# Patient Record
Sex: Male | Born: 1945 | Race: White | Hispanic: No | Marital: Single | Smoking: Former smoker
Health system: Southern US, Community
[De-identification: ages and names within clinical notes are randomized; demographics above are authoritative.]

## PROBLEM LIST (undated history)

## (undated) DIAGNOSIS — C801 Malignant (primary) neoplasm, unspecified: Secondary | ICD-10-CM

## (undated) HISTORY — PX: OTHER SURGICAL HISTORY: SHX169

---

## 2020-07-08 ENCOUNTER — Other Ambulatory Visit: Payer: Self-pay

## 2020-07-08 ENCOUNTER — Emergency Department (HOSPITAL_BASED_OUTPATIENT_CLINIC_OR_DEPARTMENT_OTHER)
Admission: EM | Admit: 2020-07-08 | Discharge: 2020-07-08 | Disposition: A | Discharge status: Home / Self Care | Payer: No Typology Code available for payment source | Attending: Emergency Medicine | Admitting: Emergency Medicine

## 2020-07-08 ENCOUNTER — Encounter (HOSPITAL_BASED_OUTPATIENT_CLINIC_OR_DEPARTMENT_OTHER): Payer: Self-pay | Admitting: Emergency Medicine

## 2020-07-08 ENCOUNTER — Emergency Department (HOSPITAL_BASED_OUTPATIENT_CLINIC_OR_DEPARTMENT_OTHER): Payer: No Typology Code available for payment source

## 2020-07-08 DIAGNOSIS — U071 COVID-19: Secondary | ICD-10-CM | POA: Diagnosis not present

## 2020-07-08 DIAGNOSIS — Z85828 Personal history of other malignant neoplasm of skin: Secondary | ICD-10-CM | POA: Insufficient documentation

## 2020-07-08 DIAGNOSIS — R0602 Shortness of breath: Secondary | ICD-10-CM

## 2020-07-08 DIAGNOSIS — Z87891 Personal history of nicotine dependence: Secondary | ICD-10-CM | POA: Insufficient documentation

## 2020-07-08 DIAGNOSIS — R059 Cough, unspecified: Secondary | ICD-10-CM

## 2020-07-08 HISTORY — DX: Malignant (primary) neoplasm, unspecified: C80.1

## 2020-07-08 LAB — CBC
HCT: 44.7 % (ref 39.0–52.0)
Hemoglobin: 15.4 g/dL (ref 13.0–17.0)
MCH: 29.3 pg (ref 26.0–34.0)
MCHC: 34.5 g/dL (ref 30.0–36.0)
MCV: 85.1 fL (ref 80.0–100.0)
Platelets: 103 10*3/uL — ABNORMAL LOW (ref 150–400)
RBC: 5.25 MIL/uL (ref 4.22–5.81)
RDW: 13.5 % (ref 11.5–15.5)
WBC: 3.4 10*3/uL — ABNORMAL LOW (ref 4.0–10.5)
nRBC: 0 % (ref 0.0–0.2)

## 2020-07-08 LAB — BASIC METABOLIC PANEL
Anion gap: 11 (ref 5–15)
BUN: 21 mg/dL (ref 8–23)
CO2: 22 mmol/L (ref 22–32)
Calcium: 8.7 mg/dL — ABNORMAL LOW (ref 8.9–10.3)
Chloride: 104 mmol/L (ref 98–111)
Creatinine, Ser: 1.26 mg/dL — ABNORMAL HIGH (ref 0.61–1.24)
GFR, Estimated: 59 mL/min — ABNORMAL LOW (ref >60)
Glucose, Bld: 97 mg/dL (ref 70–99)
Potassium: 3.8 mmol/L (ref 3.5–5.1)
Sodium: 137 mmol/L (ref 135–145)

## 2020-07-08 LAB — TROPONIN I (HIGH SENSITIVITY): Troponin I (High Sensitivity): 7 ng/L (ref <18)

## 2020-07-08 NOTE — ED Provider Notes (Signed)
[facility] EMERGENCY DEPARTMENT Provider Note   CSN: [id number] Arrival date & time: 07/08/20  1609     History Chief Complaint  Patient presents with  . Shortness of Breath    Fernando Benton is a 75 y.o. male.  The history is provided by the patient.  Cough Cough characteristics:  Non-productive Sputum characteristics:  Nondescript Severity:  Mild Onset quality:  Gradual Timing:  Intermittent Progression:  Waxing and waning Chronicity:  New Context: not upper respiratory infection   Context comment:  First covid vaccine several days ago and some intermittent cough and SOB. No chest heavyiness or crushing chest pain.  Relieved by:  Nothing Worsened by:  Nothing Associated symptoms: shortness of breath   Associated symptoms: no chest pain, no chills, no diaphoresis, no ear fullness, no ear pain, no fever, no rash and no sore throat   Risk factors: no recent infection   Risk factors comment:  Former smoker      Past Medical History:  Diagnosis Date  . 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   History reviewed. No pertinent surgical history.     No family history on file.  Social History   Tobacco Use  . Smoking status: Former Research scientist (life sciences)  . Smokeless tobacco: Never Used  Substance Use Topics  . Alcohol use: Yes    Alcohol/week: 1.0 standard drink    Types: 1 Glasses of wine per week    Comment: weekly  . Drug use: Not Currently    Types: Marijuana    Home Medications Prior to Admission medications   Not on File    Allergies    Patient has no allergy information on record.  Review of Systems   Review of Systems  Constitutional: Negative for chills, diaphoresis and fever.  HENT: Negative for ear pain and sore throat.   Eyes: Negative for pain and visual disturbance.  Respiratory: Positive for cough and shortness of breath.   Cardiovascular: Negative for chest pain and palpitations.   Gastrointestinal: Negative for abdominal pain and vomiting.  Genitourinary: Negative for dysuria and hematuria.  Musculoskeletal: Negative for arthralgias and back pain.  Skin: Negative for color change and rash.  Neurological: Negative for seizures and syncope.  All other systems reviewed and are negative.   Physical Exam Updated Vital Signs  ED Triage Vitals  Enc Vitals Group     BP 07/08/20 1623 131/84     Pulse Rate 07/08/20 1623 62     Resp 07/08/20 1623 18     Temp 07/08/20 1623 98.4 F (36.9 C)     Temp Source 07/08/20 1623 Oral     SpO2 07/08/20 1623 100 %     Weight 07/08/20 1624 193 lb (87.5 kg)     Height 07/08/20 1624 6\' 3"  (1.905 m)     Head Circumference --      Peak Flow --      Pain Score 07/08/20 1624 0     Pain Loc --      Pain Edu? --      Excl. in [place]? --     Physical Exam Vitals and nursing note reviewed.  Constitutional:      General: He is not in acute distress.    Appearance: He is well-developed and well-nourished. He is not ill-appearing.  HENT:     Head: Normocephalic and atraumatic.  Eyes:     Conjunctiva/sclera: Conjunctivae normal.  Pupils: Pupils are equal, round, and reactive to light.  Cardiovascular:     Rate and Rhythm: Normal rate and regular rhythm.     Pulses: Normal pulses.     Heart sounds: Normal heart sounds. No murmur heard.   Pulmonary:     Effort: Pulmonary effort is normal. No respiratory distress.     Breath sounds: Normal breath sounds. No decreased breath sounds or wheezing.  Abdominal:     Palpations: Abdomen is soft.     Tenderness: There is no abdominal tenderness.  Musculoskeletal:        General: No edema.     Cervical back: Normal range of motion and neck supple.  Skin:    General: Skin is warm and dry.     Capillary Refill: Capillary refill takes less than 2 seconds.  Neurological:     General: No focal deficit present.     Mental Status: He is alert.  Psychiatric:        Mood and Affect: Mood  and affect normal.     ED Results / Procedures / Treatments   Labs (all labs ordered are listed, but only abnormal results are displayed) Labs Reviewed  BASIC METABOLIC PANEL - Abnormal; Notable for the following components:      Result Value   Creatinine, Ser 1.26 (*)    Calcium 8.7 (*)    GFR, Estimated 59 (*)    All other components within normal limits  CBC - Abnormal; Notable for the following components:   WBC 3.4 (*)    Platelets 103 (*)    All other components within normal limits  SARS CORONAVIRUS 2 (TAT 6-24 HRS)  TROPONIN I (HIGH SENSITIVITY)    EKG EKG Interpretation  Date/Time:  Saturday July 08 2020 16:26:43 EST Ventricular Rate:  78 PR Interval:    QRS Duration: 92 QT Interval:  400 QTC Calculation: 456 R Axis:   95 Text Interpretation: Sinus rhythm Rightward axis Nonspecific ST abnormality Abnormal ECG Confirmed by Lennice Sites [phone]) on 07/08/2020 6:38:28 PM   Radiology DG Chest 2 View  Result Date: 07/08/2020 CLINICAL DATA:  Chest pain and shortness of breath. EXAM: CHEST - 2 VIEW COMPARISON:  None FINDINGS: Heart size is normal. Mediastinal shadows are normal. 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 No sign of infiltrate, effusion or collapse. Snap artifacts overlie both upper lobes. IMPRESSION: No active disease. 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 Electronically Signed   By: Nelson Chimes M.D.   On: 07/08/2020 17:21    Procedures Procedures (including critical care time)  Medications Ordered in ED Medications - No data to display  ED Course  I have reviewed the triage vital signs and the nursing notes.  Pertinent labs & imaging results that were available during my care of the patient were reviewed by me and considered in my medical decision making (see chart for details).    MDM Rules/Calculators/A&P                          Fernando Benton is a 75 year old male with no significant medical history presents the ED with cough,  shortness of breath.  No fever.  Normal vitals.  Ongoing for the last several days.  Recently had his first Covid vaccine.  Has nondescript cough.  No real chest pain.  Overall appears comfortable.  Not having any active symptoms currently.  Chest x-ray shows no signs of pneumonia, no pneumothorax.  Troponin normal.  EKG  shows sinus rhythm.  No ischemic changes.  Doubt ACS.  Overall suspect either he has Covid or vaccine side effect or other nonemergent process.  Lab work was overall unremarkable.  Will check patient for Covid.  Recommend that he continue to monitor symptoms at home and if symptoms worsen to return especially if he develops severe crushing chest pain or severe shortness of breath.  Recommend close follow-up with primary care doctor discharged in ED in good condition.  This chart was dictated using voice recognition software.  Despite best efforts to proofread,  errors can occur which can change the documentation meaning.  Fernando Benton was evaluated in Emergency Department on 07/08/2020 for the symptoms described in the history of present illness. He was evaluated in the context of the global COVID-19 pandemic, which necessitated consideration that the patient might be at risk for infection with the SARS-CoV-2 virus that causes COVID-19. Institutional protocols and algorithms that pertain to the evaluation of patients at risk for COVID-19 are in a state of rapid change based on information released by regulatory bodies including the CDC and federal and state organizations. These policies and algorithms were followed during the patient's care in the ED.   Final Clinical Impression(s) / ED Diagnoses Final diagnoses:  Shortness of breath  Cough    Rx / DC Orders ED Discharge Orders    None       Lennice Sites, DO 07/08/20 1912

## 2020-07-08 NOTE — ED Notes (Signed)
Spoke w pt family member off pt cell phone explained process of how to access mychart, that pt had covid swab completed in ED. Explained to pt how to access mychart to review results. Reassured pt vitals WNL, pt left ED w/o incident.

## 2020-07-08 NOTE — ED Triage Notes (Addendum)
Reports having SOB all day today.  Got Pfizer vaccine on the 5th (1st shot).  Also endorses diarrhea for the last month.  Has been seen at the [facility] regarding the diarrhea.  Reports they drew blood yesterday.  Will get results on Monday.  Reports decreased appetite.   Also reporting intermittent chest pain.

## 2020-07-09 LAB — SARS CORONAVIRUS 2 (TAT 6-24 HRS): SARS Coronavirus 2: POSITIVE — AB

## 2020-07-10 ENCOUNTER — Telehealth: Payer: Self-pay

## 2020-07-10 NOTE — Telephone Encounter (Signed)
I connected by phone with Fernando Benton on 07/10/2020 at 11:15 AM to discuss the potential use of a new treatment for mild to moderate COVID-19 viral infection in non-hospitalized patients.  This patient is a 75 y.o. male that meets the FDA criteria for Emergency Use Authorization of COVID monoclonal antibody sotrovimab.  Has a (+) direct SARS-CoV-2 viral test result  Has mild or moderate COVID-19   Is NOT hospitalized due to COVID-19  Is within 10 days of symptom onset  Has at least one of the high risk factor(s) for progression to severe COVID-19 and/or hospitalization as defined in EUA.  Specific high risk criteria : Older age (>/= 76 yo)   I have spoken and communicated the following to the patient or parent/caregiver regarding COVID monoclonal antibody treatment:  1. FDA has authorized the emergency use for the treatment of mild to moderate COVID-19 in adults and pediatric patients with positive results of direct SARS-CoV-2 viral testing who are 38 years of age and older weighing at least 40 kg, and who are at high risk for progressing to severe COVID-19 and/or hospitalization.  2. The significant known and potential risks and benefits of COVID monoclonal antibody, and the extent to which such potential risks and benefits are unknown.  3. Information on available alternative treatments and the risks and benefits of those alternatives, including clinical trials.  4. Patients treated with COVID monoclonal antibody should continue to self-isolate and use infection control measures (e.g., wear mask, isolate, social distance, avoid sharing personal items, clean and disinfect "high touch" surfaces, and frequent handwashing) according to CDC guidelines.   5. The patient or parent/caregiver has the option to accept or refuse COVID monoclonal antibody treatment.  After reviewing this information with the patient, the patient has DECLINED offer to receive the infusion. Fernando Moores,  RN 07/10/2020 11:15 AM

## 2020-09-04 ENCOUNTER — Emergency Department (HOSPITAL_BASED_OUTPATIENT_CLINIC_OR_DEPARTMENT_OTHER): Payer: No Typology Code available for payment source

## 2020-09-04 ENCOUNTER — Encounter (HOSPITAL_BASED_OUTPATIENT_CLINIC_OR_DEPARTMENT_OTHER): Payer: Self-pay | Admitting: Emergency Medicine

## 2020-09-04 ENCOUNTER — Emergency Department (HOSPITAL_BASED_OUTPATIENT_CLINIC_OR_DEPARTMENT_OTHER)
Admission: EM | Admit: 2020-09-04 | Discharge: 2020-09-04 | Disposition: A | Discharge status: Home / Self Care | Payer: No Typology Code available for payment source | Attending: Emergency Medicine | Admitting: Emergency Medicine

## 2020-09-04 ENCOUNTER — Other Ambulatory Visit: Payer: Self-pay

## 2020-09-04 DIAGNOSIS — Z859 Personal history of malignant neoplasm, unspecified: Secondary | ICD-10-CM | POA: Diagnosis not present

## 2020-09-04 DIAGNOSIS — S199XXA Unspecified injury of neck, initial encounter: Secondary | ICD-10-CM | POA: Diagnosis present

## 2020-09-04 DIAGNOSIS — Y92410 Unspecified street and highway as the place of occurrence of the external cause: Secondary | ICD-10-CM | POA: Diagnosis not present

## 2020-09-04 DIAGNOSIS — S0990XA Unspecified injury of head, initial encounter: Secondary | ICD-10-CM | POA: Insufficient documentation

## 2020-09-04 DIAGNOSIS — S161XXA Strain of muscle, fascia and tendon at neck level, initial encounter: Secondary | ICD-10-CM | POA: Insufficient documentation

## 2020-09-04 DIAGNOSIS — Z87891 Personal history of nicotine dependence: Secondary | ICD-10-CM | POA: Insufficient documentation

## 2020-09-04 MED ORDER — HYDROCODONE-ACETAMINOPHEN 5-325 MG PO TABS: 1.0000 | ORAL_TABLET | Freq: Four times a day (QID) | ORAL | 0 refills | Status: AC | PRN

## 2020-09-04 MED ORDER — NAPROXEN 250 MG PO TABS
500.0000 mg | ORAL_TABLET | Freq: Once | ORAL | Status: AC
  Administered 2020-09-04: 500 mg via ORAL
  Filled 2020-09-04: qty 2
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
## 2020-09-14 ENCOUNTER — Encounter (HOSPITAL_BASED_OUTPATIENT_CLINIC_OR_DEPARTMENT_OTHER): Payer: Self-pay | Admitting: Emergency Medicine

## 2020-09-14 ENCOUNTER — Other Ambulatory Visit: Payer: Self-pay

## 2020-09-14 DIAGNOSIS — Z859 Personal history of malignant neoplasm, unspecified: Secondary | ICD-10-CM | POA: Insufficient documentation

## 2020-09-14 DIAGNOSIS — M542 Cervicalgia: Secondary | ICD-10-CM | POA: Insufficient documentation

## 2020-09-14 DIAGNOSIS — Z87891 Personal history of nicotine dependence: Secondary | ICD-10-CM | POA: Insufficient documentation
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
## 2020-09-15 ENCOUNTER — Emergency Department (HOSPITAL_BASED_OUTPATIENT_CLINIC_OR_DEPARTMENT_OTHER)
Admission: EM | Admit: 2020-09-15 | Discharge: 2020-09-15 | Disposition: A | Discharge status: Home / Self Care | Payer: No Typology Code available for payment source | Attending: Emergency Medicine | Admitting: Emergency Medicine

## 2020-09-15 DIAGNOSIS — S161XXD Strain of muscle, fascia and tendon at neck level, subsequent encounter: Secondary | ICD-10-CM

## 2020-09-15 MED ORDER — PREDNISONE 10 MG PO TABS: 20.0000 mg | ORAL_TABLET | Freq: Two times a day (BID) | ORAL | 0 refills | Status: AC

## 2020-09-15 MED ORDER — CYCLOBENZAPRINE HCL 10 MG PO TABS: 10.0000 mg | ORAL_TABLET | Freq: Three times a day (TID) | ORAL | 0 refills | Status: AC | PRN

## 2020-09-15 MED ORDER — PREDNISONE 20 MG PO TABS
20.0000 mg | ORAL_TABLET | Freq: Once | ORAL | Status: AC
  Administered 2020-09-15: 20 mg via ORAL
  Filled 2020-09-15: qty 1
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
## 2020-09-15 NOTE — Discharge Instructions (Addendum)
Begin taking prednisone twice daily as prescribed.  Begin taking Flexeril as prescribed as needed for pain.  Follow-up with your primary doctor if symptoms are not improving in the next week to discuss a physical therapy or possibly additional imaging studies.

## 2021-01-11 ENCOUNTER — Encounter (HOSPITAL_BASED_OUTPATIENT_CLINIC_OR_DEPARTMENT_OTHER): Payer: Self-pay | Admitting: *Deleted

## 2021-01-11 ENCOUNTER — Emergency Department (HOSPITAL_BASED_OUTPATIENT_CLINIC_OR_DEPARTMENT_OTHER)
Admission: EM | Admit: 2021-01-11 | Discharge: 2021-01-11 | Disposition: A | Discharge status: Home / Self Care | Payer: No Typology Code available for payment source | Attending: Emergency Medicine | Admitting: Emergency Medicine

## 2021-01-11 ENCOUNTER — Other Ambulatory Visit: Payer: Self-pay

## 2021-01-11 ENCOUNTER — Emergency Department (HOSPITAL_BASED_OUTPATIENT_CLINIC_OR_DEPARTMENT_OTHER): Payer: No Typology Code available for payment source

## 2021-01-11 DIAGNOSIS — Z85828 Personal history of other malignant neoplasm of skin: Secondary | ICD-10-CM | POA: Diagnosis not present

## 2021-01-11 DIAGNOSIS — Z87891 Personal history of nicotine dependence: Secondary | ICD-10-CM | POA: Diagnosis not present

## 2021-01-11 DIAGNOSIS — I10 Essential (primary) hypertension: Secondary | ICD-10-CM

## 2021-01-11 DIAGNOSIS — R519 Headache, unspecified: Secondary | ICD-10-CM | POA: Diagnosis present

## 2021-01-11 MED ORDER — LACTATED RINGERS IV BOLUS
1000.0000 mL | Freq: Once | INTRAVENOUS | Status: AC
  Administered 2021-01-11: 1000 mL via INTRAVENOUS

## 2021-01-11 MED ORDER — PROCHLORPERAZINE EDISYLATE 10 MG/2ML IJ SOLN
10.0000 mg | Freq: Once | INTRAMUSCULAR | Status: AC
  Administered 2021-01-11: 10 mg via INTRAVENOUS
  Filled 2021-01-11: qty 2

## 2021-01-11 MED ORDER — DIPHENHYDRAMINE HCL 50 MG/ML IJ SOLN
25.0000 mg | Freq: Once | INTRAMUSCULAR | Status: AC
  Administered 2021-01-11: 25 mg via INTRAVENOUS
  Filled 2021-01-11: qty 1

## 2021-01-11 MED ORDER — DEXAMETHASONE SODIUM PHOSPHATE 10 MG/ML IJ SOLN
10.0000 mg | Freq: Once | INTRAMUSCULAR | Status: AC
  Administered 2021-01-11: 10 mg via INTRAVENOUS
  Filled 2021-01-11: qty 1

## 2021-01-11 NOTE — ED Triage Notes (Signed)
Pt reports HA since march 2022.  No OTC meds since last week.

## 2021-01-11 NOTE — Discharge Instructions (Addendum)
Your CT scan did not show any evidence of a brain injury.  If your headaches continue to bother you, please follow-up with the neurology office.
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
## 2021-01-14 ENCOUNTER — Telehealth (HOSPITAL_BASED_OUTPATIENT_CLINIC_OR_DEPARTMENT_OTHER): Payer: Self-pay

## 2021-01-14 NOTE — Telephone Encounter (Signed)
Pt called to ask about the medications listed on his AVS from recent visit. Pt advised no medication was prescribed during that visit and the medications he was seeing are old medications that he can ask his PCP to take off his list. Pt had no further questions or needs at this time.

## 2021-02-05 ENCOUNTER — Ambulatory Visit: Payer: Medicare Other | Admitting: Diagnostic Neuroimaging

## 2022-12-07 IMAGING — CT CT HEAD W/O CM
3 series · 16 of 47 positions shown, 19 images · non-contrast
Comparison: None.

CLINICAL DATA: 74-year-old male with headache.

EXAM:
CT HEAD WITHOUT CONTRAST
TECHNIQUE: Contiguous axial images were obtained from the base of the skull
through the vertex without intravenous contrast.

[Series 2: head wo · axial · 0.46mm/px · z∈[-192,-38]mm · 10 of 37 slices shown, 13 images]
[im 3/37  brain]
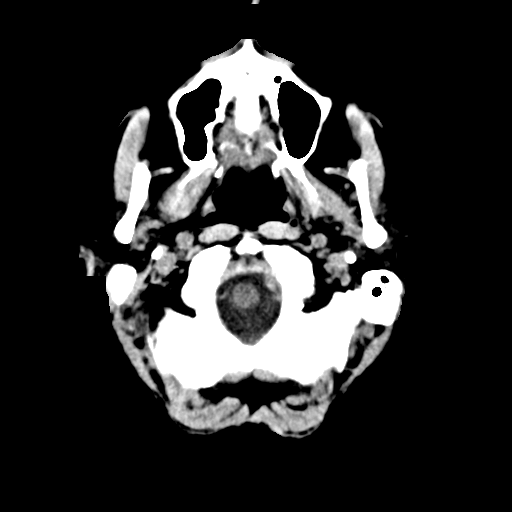
[im 3/37  bone]
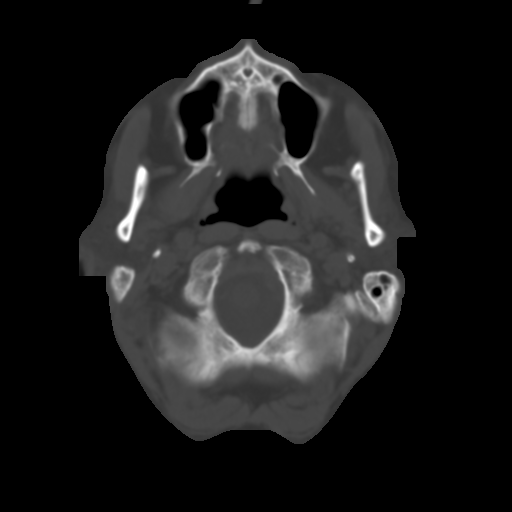
[im 7/37  brain]
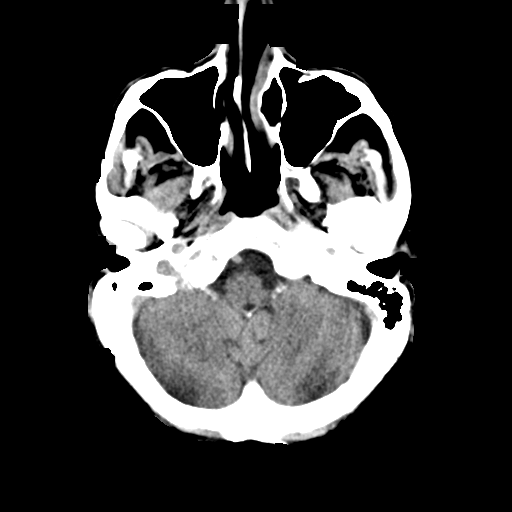
[im 10/37  brain]
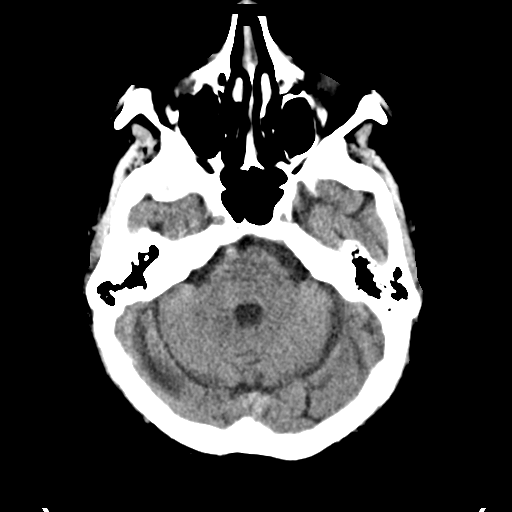
[im 13/37  brain]
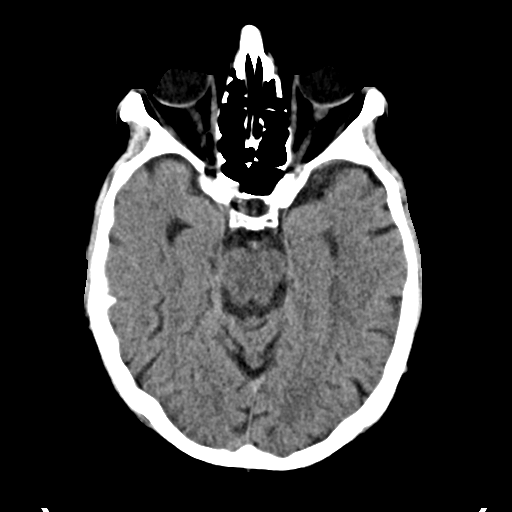
[im 17/37  brain]
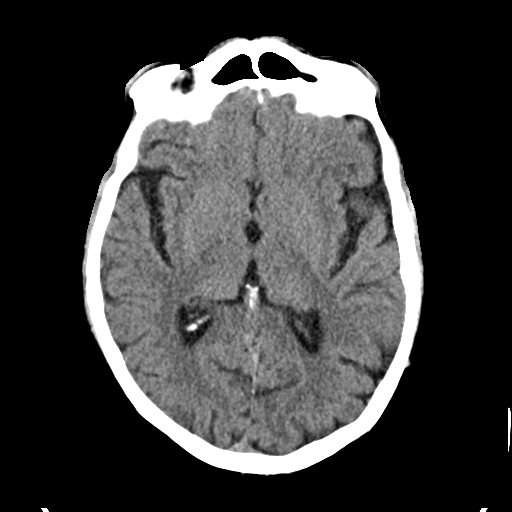
[im 17/37  bone]
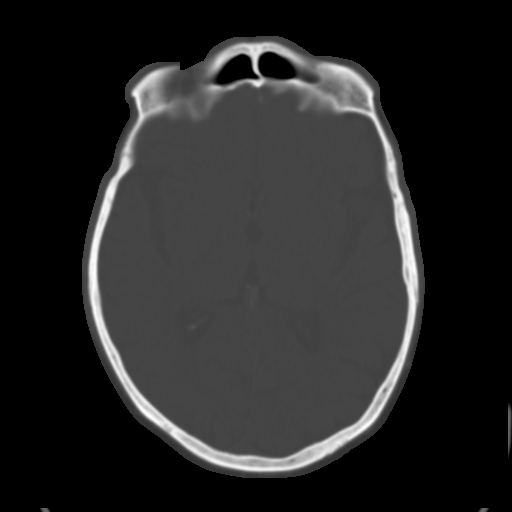
[im 20/37  brain]
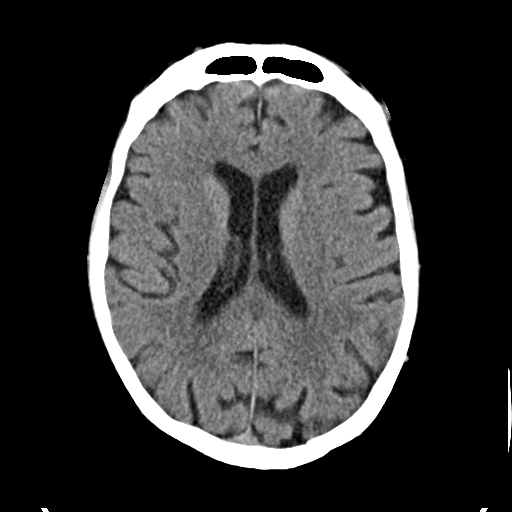
[im 24/37  brain]
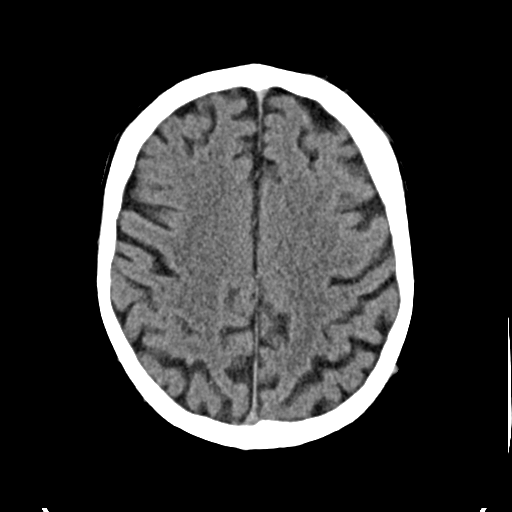
[im 28/37  brain]
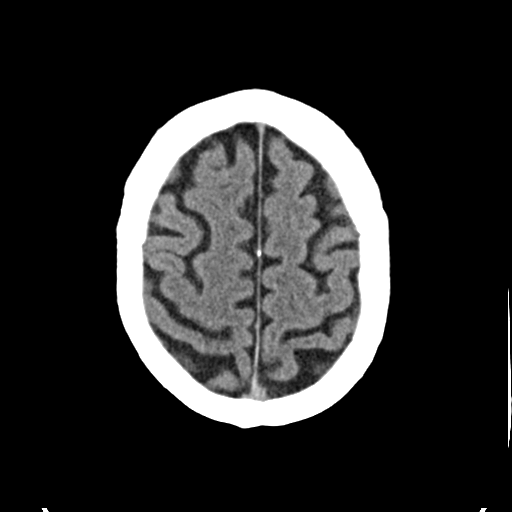
[im 30/37  brain]
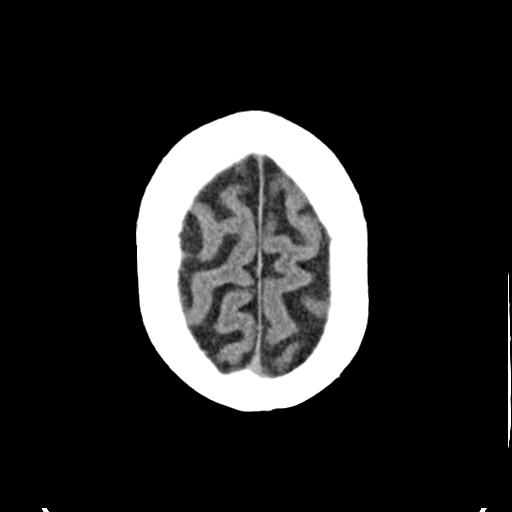
[im 30/37  bone]
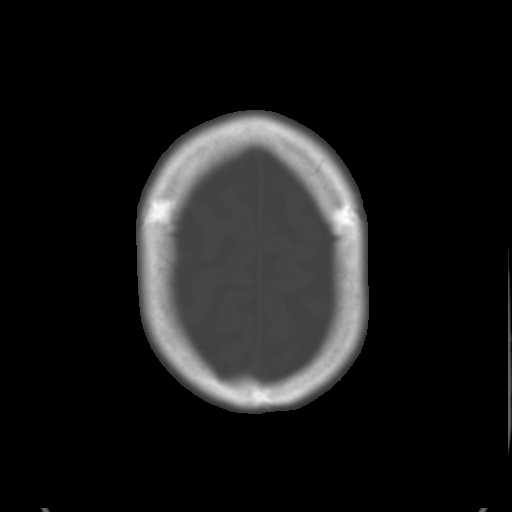
[im 34/37  brain]
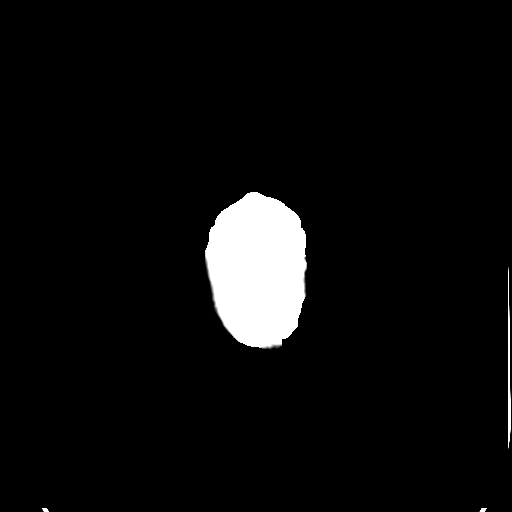

[Series 4: coronal soft · coronal · 0.36mm/px · 3 of 75 slices shown]
[im 25/75  brain]
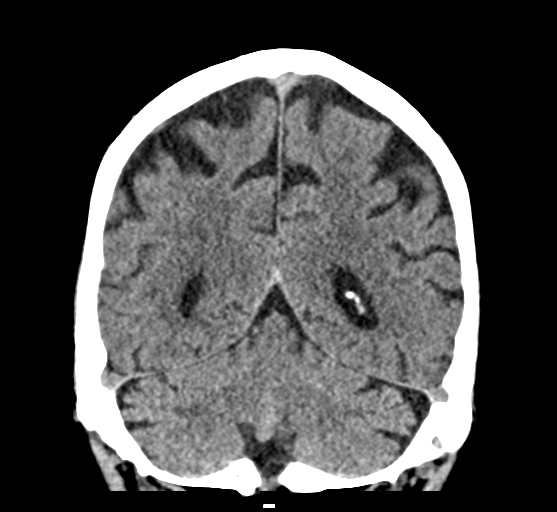
[im 33/75  brain]
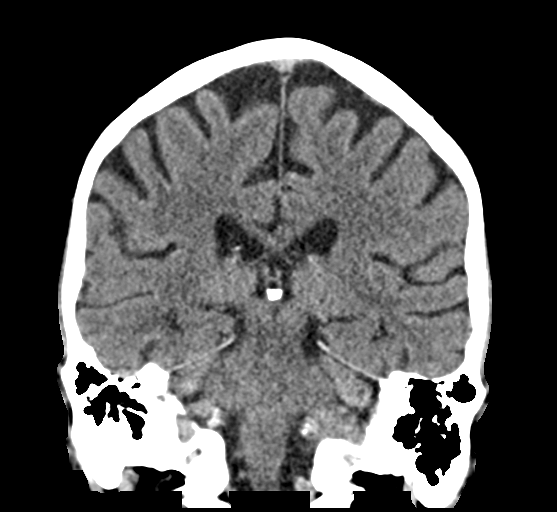
[im 42/75  brain]
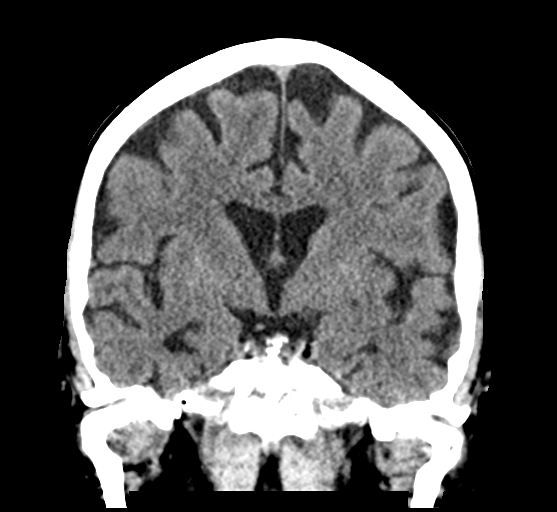

[Series 5: sag soft · sagittal · 0.35mm/px · 3 of 64 slices shown]
[im 22/64  brain]
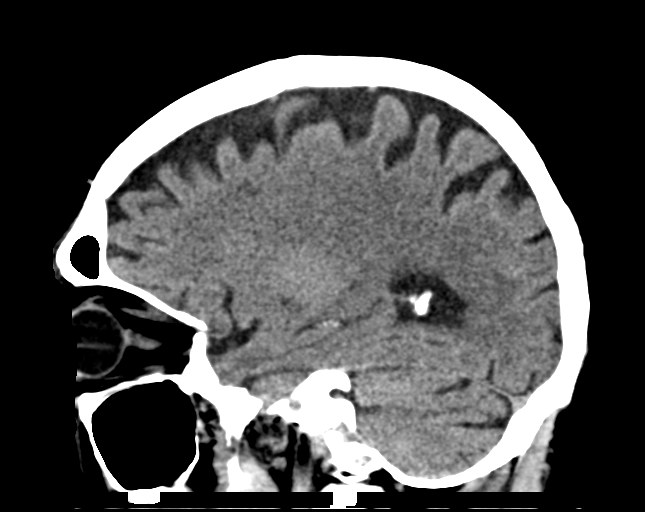
[im 32/64  brain]
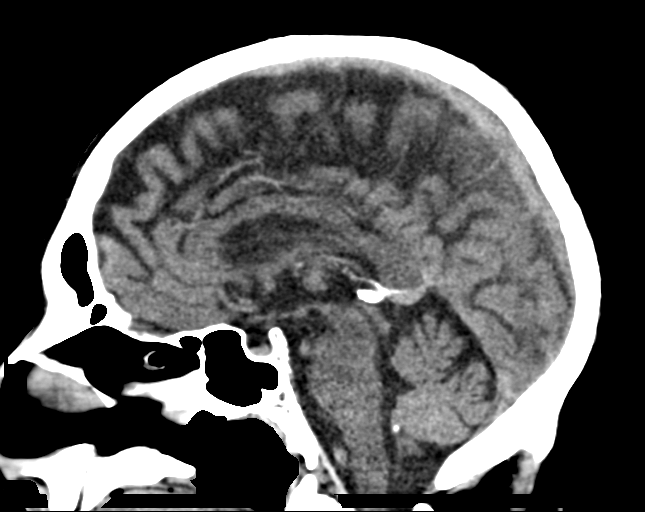
[im 43/64  brain]
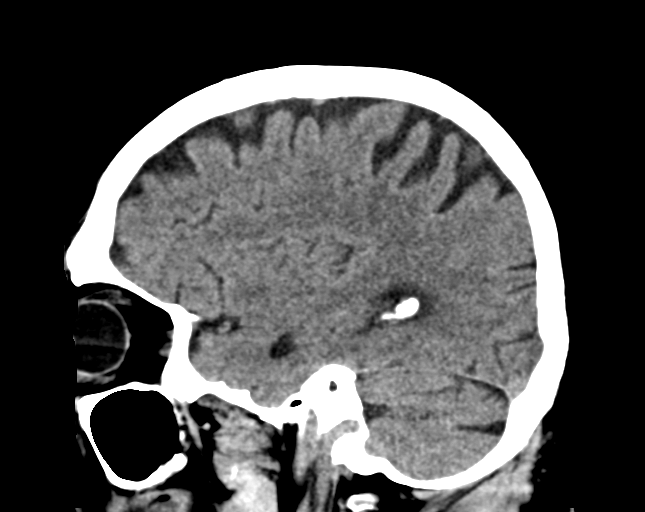

[16 of 47 positions shown; findings below may reference images not displayed]

FINDINGS: Brain: Mild age-related atrophy and chronic microvascular ischemic
changes. There is no acute intracranial hemorrhage. No mass effect
or midline shift. No extra-axial fluid collection.

Vascular: No hyperdense vessel or unexpected calcification.

Skull: Normal. Negative for fracture or focal lesion.

Sinuses/Orbits: No acute finding.

Other: None
IMPRESSION: No acute intracranial pathology.

## 2024-05-05 ENCOUNTER — Other Ambulatory Visit: Payer: Self-pay

## 2024-05-05 ENCOUNTER — Emergency Department (HOSPITAL_BASED_OUTPATIENT_CLINIC_OR_DEPARTMENT_OTHER)

## 2024-05-05 ENCOUNTER — Emergency Department (HOSPITAL_BASED_OUTPATIENT_CLINIC_OR_DEPARTMENT_OTHER)
Admission: EM | Admit: 2024-05-05 | Discharge: 2024-05-05 | Disposition: A | Discharge status: Home / Self Care | Attending: Emergency Medicine | Admitting: Emergency Medicine

## 2024-05-05 ENCOUNTER — Encounter (HOSPITAL_BASED_OUTPATIENT_CLINIC_OR_DEPARTMENT_OTHER): Payer: Self-pay | Admitting: Emergency Medicine

## 2024-05-05 DIAGNOSIS — K1120 Sialoadenitis, unspecified: Secondary | ICD-10-CM | POA: Diagnosis not present

## 2024-05-05 DIAGNOSIS — R221 Localized swelling, mass and lump, neck: Secondary | ICD-10-CM | POA: Diagnosis present

## 2024-05-05 NOTE — Discharge Instructions (Signed)
 I think that the swelling in your neck was likely due to a small obstruction in your salivary glands.  I recommend using some hard candies over the next few days to help improve flow.  Follow-up with your primary care doctor.

## 2024-05-05 NOTE — ED Provider Notes (Signed)
  [facility] DEPARTMENT AT [facility] Provider Note   CSN: [id number] Arrival date & time: 05/05/24  1911     Patient presents with: Neck Swelling   Fernando Benton is a 78 y.o. male.   78 year old male reports having sudden onset swelling on his head his neck tonight.  Patient states he was eating potato.  He has never had anything like this, on his way to the emergency department, that symptoms improved.  No difficulty swallowing now.        Prior to Admission medications   Medication Sig Start Date End Date Taking? Authorizing Provider  cyclobenzaprine  (FLEXERIL ) 10 MG tablet Take 1 tablet (10 mg total) by mouth 3 (three) times daily as needed for muscle spasms. 09/15/20   Geroldine Berg, MD  HYDROcodone -acetaminophen  (NORCO) 5-325 MG tablet Take 1 tablet by mouth every 6 (six) hours as needed for severe pain. 09/04/20   Molpus, John, MD  predniSONE  (DELTASONE ) 10 MG tablet Take 2 tablets (20 mg total) by mouth 2 (two) times daily. 09/15/20   Geroldine Berg, MD    Allergies: Patient has no known allergies.    Review of Systems  Updated Vital Signs BP (!) 186/70 (BP Location: Right Arm)   Pulse (!) 56   Temp 97.7 F (36.5 C)   Resp 20   Ht 6' 3.5 (1.918 m)   Wt 90.7 kg   SpO2 98%   BMI 24.67 kg/m   Physical Exam Vitals and nursing note reviewed.  HENT:     Mouth/Throat:     Comments: No swelling of the tongue uvula or oropharynx.  No swelling of the floor of the mouth.   Eyes:     Pupils: Pupils are equal, round, and reactive to light.  Neck:     Comments: Mild bilateral submandibular gland enlargements Cardiovascular:     Rate and Rhythm: Normal rate.  Neurological:     Mental Status: He is alert.     (all labs ordered are listed, but only abnormal results are displayed) Labs Reviewed - No data to display  EKG: None  Radiology: No results found.   Procedures   Medications Ordered in the ED - No data to display                                   Medical Decision Making 78 year old male here today for neck swelling which has improved.  Differential diagnosis include sialadenitis, lymphadenopathy, less likely infection.  Plan-will obtain plain films of the patient's soft tissue.  Believe this is likely sialadenitis given the sudden onset and rapid improvement.  No evidence of infection on exam.  Reassessment 9:40 PM-negative plain films.  Review and I agree with radiology interpretation.  Will discharge with instructions for sialagogues.  Amount and/or Complexity of Data Reviewed Radiology: ordered.        Final diagnoses:  None    ED Discharge Orders     None          Mannie Fairy DASEN, DO 05/05/24 2253

## 2024-05-05 NOTE — ED Triage Notes (Signed)
 Pt reports he felt 2 hard knots on either side of neck suddenly after eating tonight; denies difficulty breathing or swallowing; NAD; sts he feels it is better now
